# Patient Record
Sex: Male | Born: 1955 | Race: White | Hispanic: No | Marital: Married | Smoking: Former smoker
Health system: Southern US, Community
[De-identification: ages and names within clinical notes are randomized; demographics above are authoritative.]

## PROBLEM LIST (undated history)

## (undated) DIAGNOSIS — Z789 Other specified health status: Secondary | ICD-10-CM

## (undated) HISTORY — PX: APPENDECTOMY: SHX54

## (undated) HISTORY — PX: OTHER SURGICAL HISTORY: SHX169

---

## 2004-07-30 ENCOUNTER — Emergency Department (HOSPITAL_COMMUNITY): Admission: EM | Admit: 2004-07-30 | Discharge: 2004-07-31 | Payer: Self-pay | Admitting: Emergency Medicine

## 2004-08-03 ENCOUNTER — Ambulatory Visit (HOSPITAL_BASED_OUTPATIENT_CLINIC_OR_DEPARTMENT_OTHER): Admission: RE | Admit: 2004-08-03 | Discharge: 2004-08-03 | Payer: Self-pay | Admitting: Urology

## 2007-08-07 ENCOUNTER — Encounter: Admission: RE | Admit: 2007-08-07 | Discharge: 2007-08-07 | Payer: Self-pay | Admitting: Family Medicine

## 2011-04-29 NOTE — Op Note (Signed)
NAMEMICKEL, SCHREUR                         ACCOUNT NO.:  [id number]   MEDICAL RECORD NO.:  [id number]                   PATIENT TYPE:  AMB   LOCATION:  [facility]                                 FACILITY:  [facility]   PHYSICIAN:  Jamison Neighbor, M.D.               DATE OF BIRTH:  05/26/1956   DATE OF PROCEDURE:  08/03/2004  DATE OF DISCHARGE:                                 OPERATIVE REPORT   PREOPERATIVE DIAGNOSIS:  Right ureterovesical junction stone with associated  hydronephrosis.   POSTOPERATIVE DIAGNOSIS:  Right ureterovesical junction stone with  associated hydronephrosis.   PROCEDURE:  1. Cystoscopy.  2. Right retrograde, right ureteroscopy with basket extraction.   SURGEON:  Jamison Neighbor, M.D.   ANESTHESIA:  General.   COMPLICATIONS:  None.   DRAINS:  None.   BRIEF HISTORY:  This 55 year old male presented to the emergency room at  [facility] where a CT scan revealed a right UVJ stone.  The patient has had  right flank pain.  The patient was advised that the stone might pass but  because his pain has been persistent and poorly controlled, he has requested  basket extraction.  He understands the risks and benefits of the procedure  and gave full informed consent.   DESCRIPTION OF PROCEDURE:  After the successful induction of general  anesthesia, the patient was placed in the dorsal lithotomy position, prepped  with Betadine, and draped in the usual sterile fashion.  Cystoscopy was  performed, and the urethra was visualized in its entirety and was found to  be normal.  Beyond the verumontanum, the prostate was not particularly  enlarged.  There was no evidence of any bladder outlet obstruction.  The  bladder was carefully inspected.  It was free of any tumor or stones.  The  right ureteral orifice was noted to be edematous secondary to the  obstruction.  The left ureter was normal.  With some difficulty, the  ureteral catheter was advanced into the ureter.  It was  difficult to see the  ureteral opening due to all the heaped up inflamed mucosa, but a retrograde  study demonstrated hydronephrosis down to a tight spot right at the  intramural tunnel.  A guidewire was passed to the kidney.  The ureteroscope  was advanced into the ureter and without the need for dilation.  In the  distal ureter, a stone was seen.  Under direct vision, the basket was used  to grasp the stone, which was removed.  The ureteroscope was then  readvanced, and the remainder of the distal ureter up to the iliac vessels  was inspected.  No obstruction was noted.  Because this was so atraumatic  and dilation was not required, it was not felt that double-J catheter was  required.  The guidewire was removed.  The bladder was drained.  The patient  tolerated the procedure well and was taken to the  recovery room in good  condition.  He received intraoperative Toradol and Zofran as well as a B&O  suppository.  He will continue on his pain medication.  He will return to  see me in follow up.                                               Jamison Neighbor, M.D.    RJE/MEDQ  D:  08/03/2004  T:  08/03/2004  Job:  [id number]

## 2015-05-27 ENCOUNTER — Encounter: Payer: Self-pay | Admitting: Podiatry

## 2015-05-27 ENCOUNTER — Ambulatory Visit (INDEPENDENT_AMBULATORY_CARE_PROVIDER_SITE_OTHER): Payer: BLUE CROSS/BLUE SHIELD

## 2015-05-27 ENCOUNTER — Ambulatory Visit (INDEPENDENT_AMBULATORY_CARE_PROVIDER_SITE_OTHER): Payer: BLUE CROSS/BLUE SHIELD | Admitting: Podiatry

## 2015-05-27 DIAGNOSIS — M722 Plantar fascial fibromatosis: Secondary | ICD-10-CM

## 2015-05-27 DIAGNOSIS — R52 Pain, unspecified: Secondary | ICD-10-CM

## 2015-05-27 NOTE — Patient Instructions (Signed)
Bent - Knee Calf Stretch  1) Stand an arm's length away from a wall. Place the palms of your hands on the wall. Step forward about 12 inches with the opposite foot.  2) Keeping toes pointed forward and both heels on the floor, bend both knees and lean forward. Hold this position for 60 seconds. Don't arch your back and don't hunch your shoulders.  3) Repeat this twice.  DO THIS STRETCHING TECHNIQUE 3 TIMES A DAY.   Stretching Exercises before Standing      Pull your toes up toward your nose and hold for 1 minute before standing.  A towel can assist with this exercise if you put the towel under the ball of your foot. This exercise reduces the intense    pain associated when changing from a seated to a standing position. This stretch can usually be the most beneficial if done before getting out of bed in the mornings. Plantar Fasciitis Plantar fasciitis is a common condition that causes foot pain. It is soreness (inflammation) of the band of tough fibrous tissue on the bottom of the foot that runs from the heel bone (calcaneus) to the ball of the foot. The cause of this soreness may be from excessive standing, poor fitting shoes, running on hard surfaces, being overweight, having an abnormal walk, or overuse (this is common in runners) of the painful foot or feet. It is also common in aerobic exercise dancers and ballet dancers. SYMPTOMS  Most people with plantar fasciitis complain of:  Severe pain in the morning on the bottom of their foot especially when taking the first steps out of bed. This pain recedes after a few minutes of walking.  Severe pain is experienced also during walking following a long period of inactivity.  Pain is worse when walking barefoot or up stairs DIAGNOSIS   Your caregiver will diagnose this condition by examining and feeling your foot.  Special tests such as X-rays of your foot, are usually not needed. PREVENTION   Consult a sports medicine professional  before beginning a new exercise program.  Walking programs offer a good workout. With walking there is a lower chance of overuse injuries common to runners. There is less impact and less jarring of the joints.  Begin all new exercise programs slowly. If problems or pain develop, decrease the amount of time or distance until you are at a comfortable level.  Wear good shoes and replace them regularly.  Stretch your foot and the heel cords at the back of the ankle (Achilles tendon) both before and after exercise.  Run or exercise on even surfaces that are not hard. For example, asphalt is better than pavement.  Do not run barefoot on hard surfaces.  If using a treadmill, vary the incline.  Do not continue to workout if you have foot or joint problems. Seek professional help if they do not improve. HOME CARE INSTRUCTIONS   Avoid activities that cause you pain until you recover.  Use ice or cold packs on the problem or painful areas after working out.  Only take over-the-counter or prescription medicines for pain, discomfort, or fever as directed by your caregiver.  Soft shoe inserts or athletic shoes with air or gel sole cushions may be helpful.  If problems continue or become more severe, consult a sports medicine caregiver or your own health care provider. Cortisone is a potent anti-inflammatory medication that may be injected into the painful area. You can discuss this treatment with your caregiver. MAKE   SURE YOU:   Understand these instructions.  Will watch your condition.  Will get help right away if you are not doing well or get worse. Document Released: 08/23/2001 Document Revised: 02/20/2012 Document Reviewed: 10/22/2008 ExitCare Patient Information 2015 [employer]. This information is not intended to replace advice given to you by your health care provider. Make sure you discuss any questions you have with your health care provider.  

## 2015-05-27 NOTE — Progress Notes (Signed)
   Subjective:    Patient ID: Henry Silva, male    DOB: 01-Nov-1956, 59 y.o.   MRN: [id number]  HPI  N-BURNING SENSATION, PAIN L-LT FOOT BOTTOM AND BACK HEEL D-1-2 MONTH O-SLOWLY C-WORSE ALL DAY A-PRESSURE T-ICE, ADVIL  He denies any direct injury. 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   Eyes: Positive for itching.  Musculoskeletal: Positive for joint swelling.       Objective:   Physical Exam  Orientated 3  Vascular: DP and PT pulses 2/4 bilaterally Capillary reflex immediate bilaterally No peripheral edema noted bilaterally  Neurological: Sensation to 10 g monofilament wire intact 5/5 bilaterally Vibratory sensation intact bilaterally Ankle reflex equal and reactive bilaterally  Dermatological: No skin lesions noted bilaterally Texture and turgor adequate bilaterally  Musculoskeletal: Patient has a limping gait favoring the left foot Palpable tenderness medial plantar fascial area and central inferior heel left without any palpable lesions. This duplicates area of discomfort. 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 I offered patient Kenalog injection he verbally consents.  Skin is prepped with alcohol and Betadine and 10 mg of Kenalog mixed with 10 mg of plain Xylocaine and 2.5 mg of plain Marcaine were injected into the inferior heel  left for Kenalog injection #1. Patient tolerated procedure without any difficulty. Fasciitis strap dispensed to wear on the left foot Shoeing and stretching discussed  Reappoint if symptoms are not improving within the next 6 weeks

## 2015-05-28 DIAGNOSIS — M722 Plantar fascial fibromatosis: Secondary | ICD-10-CM

## 2015-05-28 MED ORDER — TRIAMCINOLONE ACETONIDE 10 MG/ML IJ SUSP
10.0000 mg | Freq: Once | INTRAMUSCULAR | Status: AC
  Administered 2015-05-28: 10 mg

## 2015-12-24 ENCOUNTER — Ambulatory Visit (INDEPENDENT_AMBULATORY_CARE_PROVIDER_SITE_OTHER): Payer: BLUE CROSS/BLUE SHIELD

## 2015-12-24 ENCOUNTER — Ambulatory Visit (INDEPENDENT_AMBULATORY_CARE_PROVIDER_SITE_OTHER): Payer: BLUE CROSS/BLUE SHIELD | Admitting: Podiatry

## 2015-12-24 ENCOUNTER — Encounter: Payer: Self-pay | Admitting: Podiatry

## 2015-12-24 VITALS — BP 129/82 | HR 74 | Temp 97.8°F | Resp 18

## 2015-12-24 DIAGNOSIS — M779 Enthesopathy, unspecified: Secondary | ICD-10-CM

## 2015-12-24 DIAGNOSIS — M109 Gout, unspecified: Secondary | ICD-10-CM

## 2015-12-24 DIAGNOSIS — M10071 Idiopathic gout, right ankle and foot: Secondary | ICD-10-CM | POA: Diagnosis not present

## 2015-12-24 DIAGNOSIS — R52 Pain, unspecified: Secondary | ICD-10-CM | POA: Diagnosis not present

## 2015-12-24 LAB — CBC WITH DIFFERENTIAL/PLATELET
BASOS PCT: 1 % (ref 0–1)
Basophils Absolute: 0.1 10*3/uL (ref 0.0–0.1)
EOS ABS: 0.2 10*3/uL (ref 0.0–0.7)
Eosinophils Relative: 3 % (ref 0–5)
HCT: 44.7 % (ref 39.0–52.0)
Hemoglobin: 15.2 g/dL (ref 13.0–17.0)
Lymphocytes Relative: 18 % (ref 12–46)
Lymphs Abs: 1 10*3/uL (ref 0.7–4.0)
MCH: 31.8 pg (ref 26.0–34.0)
MCHC: 34 g/dL (ref 30.0–36.0)
MCV: 93.5 fL (ref 78.0–100.0)
MONOS PCT: 13 % — AB (ref 3–12)
MPV: 9.9 fL (ref 8.6–12.4)
Monocytes Absolute: 0.7 10*3/uL (ref 0.1–1.0)
NEUTROS PCT: 65 % (ref 43–77)
Neutro Abs: 3.6 10*3/uL (ref 1.7–7.7)
PLATELETS: 258 10*3/uL (ref 150–400)
RBC: 4.78 MIL/uL (ref 4.22–5.81)
RDW: 13.2 % (ref 11.5–15.5)
WBC: 5.6 10*3/uL (ref 4.0–10.5)

## 2015-12-24 LAB — C-REACTIVE PROTEIN: CRP: 0.9 mg/dL — ABNORMAL HIGH (ref <0.60)

## 2015-12-24 LAB — URIC ACID: URIC ACID, SERUM: 6.6 mg/dL (ref 4.0–7.8)

## 2015-12-24 MED ORDER — TRAMADOL HCL 50 MG PO TABS: 50.0000 mg | ORAL_TABLET | Freq: Three times a day (TID) | ORAL | Status: DC | PRN

## 2015-12-24 MED ORDER — COLCHICINE 0.6 MG PO TABS: 0.6000 mg | ORAL_TABLET | Freq: Every day | ORAL | Status: DC

## 2015-12-24 MED ORDER — CEPHALEXIN 500 MG PO CAPS: 500.0000 mg | ORAL_CAPSULE | Freq: Three times a day (TID) | ORAL | Status: DC

## 2015-12-24 NOTE — Patient Instructions (Signed)

## 2015-12-25 LAB — SEDIMENTATION RATE: Sed Rate: 4 mm/hr (ref 0–20)

## 2015-12-27 ENCOUNTER — Encounter: Payer: Self-pay | Admitting: Podiatry

## 2015-12-27 NOTE — Progress Notes (Signed)
Patient ID: Henry Silva, male   DOB: Sep 25, 1956, 60 y.o.   MRN: [id number]  Subjective: 60 year old male presents the office with concerns of right foot big toe swelling or redness which has been ongoing for possibly one month but seems to worsen over the last couple of days. He states the areas. Painful with pressure and weightbearing and try to bend his big toe. He denies any recent injury or trauma. Denies any numbness or tingling. No open sores or drainage. Denies any systemic complaints such as fevers, chills, nausea, vomiting. No acute changes since last appointment, and no other complaints at this time.   Objective: AAO x3, NAD DP/PT pulses palpable bilaterally, CRT less than 3 seconds Protective sensation intact with Simms Weinstein monofilament There is edema and erythema to the right first MTPJ with decreased range of motion of the first MTPJ due to pain. There is no fluctuance or crepitus. No ascending cellulitis. There is mild increase in warmth of the first MTPJ. This area appears to be localized without any spreading. No other areas of tenderness to bilateral lower extremity. No areas of pinpoint bony tenderness or pain with vibratory sensation. MMT 5/5, ROM WNL. No edema, erythema, increase in warmth to bilateral lower extremities.  No open lesions or pre-ulcerative lesions.  No pain with calf compression, swelling, warmth, erythema  Assessment: 60 year old male right first MTPJ likely gout, capsulitis  Plan: -All treatment options discussed with the patient including all alternatives, risks, complications.  -X-rays were obtained and reviewed with the patient. There is no definitive evidence of acute fracture or stress fracture identified at this time. -Etiology of symptoms were discussed -At this time I discussed with him this is likely gout. Discussed steroid injection for which she wishes to proceed after discussing risks and complications. Under sterile conditions a mixture  Kenalog 10 and 0.5% Marcaine plain was infiltrated into and around the first MTPJ without compensation's. Post procedure instructions were discussed. -Prescribed colchicine -Prescribed Keflex in case of infection, although unlikely -Order blood work including uric acid, ESR, CRP, CBC -Follow-up in 2 weeks or sooner if any problems arise. In the meantime, encouraged to call the office with any questions, concerns, change in symptoms.   Celesta Gentile, DPM

## 2015-12-28 ENCOUNTER — Telehealth: Payer: Self-pay | Admitting: *Deleted

## 2015-12-28 NOTE — Telephone Encounter (Addendum)
-----   Message from Vivi BarrackMatthew R Wagoner, DPM sent at 12/27/2015  9:32 AM EST ----- Can you let him know that his blood work was mostly normal except one of the inflammation markers is slightly elevated, which is non specific. I still think he has gout. Continue medication and follow-up next week or sooner if it is worsening or not getting better.   12/28/2015 - left message with pt's son for pt to call for results.  Called pt and informed of Dr. Gabriel RungWagoner's orders, pt states has an appt 01/11/2016.

## 2016-01-11 ENCOUNTER — Ambulatory Visit (INDEPENDENT_AMBULATORY_CARE_PROVIDER_SITE_OTHER): Payer: BLUE CROSS/BLUE SHIELD | Admitting: Podiatry

## 2016-01-11 ENCOUNTER — Encounter: Payer: Self-pay | Admitting: Podiatry

## 2016-01-11 VITALS — BP 143/89 | HR 74 | Resp 17

## 2016-01-11 DIAGNOSIS — M109 Gout, unspecified: Secondary | ICD-10-CM

## 2016-01-11 DIAGNOSIS — M10071 Idiopathic gout, right ankle and foot: Secondary | ICD-10-CM | POA: Diagnosis not present

## 2016-01-11 MED ORDER — METHYLPREDNISOLONE 4 MG PO TBPK: ORAL_TABLET | ORAL | Status: DC

## 2016-01-11 NOTE — Patient Instructions (Addendum)

## 2016-01-11 NOTE — Progress Notes (Signed)
Patient ID: Henry Silva, male   DOB: August 08, 1956, 60 y.o.   MRN: [id number]  Subjective: 60 year old male persists the office if up evaluation of gout to the right foot. He states the symptoms have improved following discontinue gets some discomfort. He denies colchicine without any significant side effects. He is able to a regular shoe although does put pressure when wearing still toed shoe overlying the bunion on the right foot. Denies any systemic complaints such as fevers, chills, nausea, vomiting. No acute changes since last appointment, and no other complaints at this time.   Objective: AAO x3, NAD DP/PT pulses palpable bilaterally, CRT less than 3 seconds Protective sensation intact with Simms Weinstein monofilament They're significantly improved edema and erythema to the right first MTPJ although it does continue somewhat. Is no pain with first MTPJ range of motion. There is no other area pinpoint tenderness or pain the vibratory sensation. No areas of pinpoint bony tenderness or pain with vibratory sensation. MMT 5/5, ROM WNL. No edema, erythema, increase in warmth to bilateral lower extremities.  No open lesions or pre-ulcerative lesions.  No pain with calf compression, swelling, warmth, erythema  Assessment: Gout right foot resolving although with mild continued symptoms  Plan: -All treatment options discussed with the patient including all alternatives, risks, complications.  -Prescribed medorl dose pack. Discussed side effects of the medication and directed to stop if any are to occur and call the office.  -Discussed etiologies of gout and foods to avoid.  -Offloading pads -Follow-up if symptoms not resolved in 3 weeks or sooner if any problems arise. In the meantime, encouraged to call the office with any questions, concerns, change in symptoms.   Ovid Curd, DPM

## 2016-02-16 ENCOUNTER — Telehealth: Payer: Self-pay

## 2016-02-16 NOTE — Telephone Encounter (Signed)
PT left Vm that he is ready to schedule a colonoscopy.

## 2016-02-17 NOTE — Telephone Encounter (Signed)
Tried to call pt and mailbox is full

## 2016-02-18 NOTE — Telephone Encounter (Signed)
LMOM to call.

## 2016-02-25 NOTE — Telephone Encounter (Signed)
Letter mailed for pt to call.  

## 2016-03-02 ENCOUNTER — Ambulatory Visit
Admission: EM | Admit: 2016-03-02 | Discharge: 2016-03-02 | Disposition: A | Discharge status: Home / Self Care | Payer: Worker's Compensation | Attending: Family Medicine | Admitting: Family Medicine

## 2016-03-02 ENCOUNTER — Encounter: Payer: Self-pay | Admitting: Emergency Medicine

## 2016-03-02 ENCOUNTER — Ambulatory Visit (INDEPENDENT_AMBULATORY_CARE_PROVIDER_SITE_OTHER): Payer: Worker's Compensation

## 2016-03-02 ENCOUNTER — Telehealth: Payer: Self-pay

## 2016-03-02 DIAGNOSIS — S8992XA Unspecified injury of left lower leg, initial encounter: Secondary | ICD-10-CM | POA: Diagnosis not present

## 2016-03-02 MED ORDER — OXYCODONE-ACETAMINOPHEN 2.5-325 MG PO TABS: 1.0000 | ORAL_TABLET | Freq: Every evening | ORAL | Status: DC | PRN

## 2016-03-02 NOTE — ED Notes (Signed)
Workers Comp: Left knee injury 1 day ago

## 2016-03-02 NOTE — Telephone Encounter (Signed)
Pt's wife, Lawson FiscalLori, left message with Candy for me to call her phone number to schedule the pt's colonoscopy. I called and mail box is full and I could not leave a message.

## 2016-03-02 NOTE — ED Provider Notes (Signed)
CSN: [id number]     Arrival date & time 03/02/16  1534 History   First MD Initiated Contact with Patient 03/02/16 1656     Chief Complaint  Patient presents with  . Knee Injury   (Consider location/radiation/quality/duration/timing/severity/associated sxs/prior Treatment) HPI: Patient presents today with symptoms of left knee pain. Patient states that he was at work yesterday when his foot hit a metal rod in his knee went forward and hit something. He recalls having pain right away he denies any abrasions or bleeding of the knee. He denies any pain of the foot or ankle at this time. He has had his left knee scoped in the past which she believes was for meniscal tear. He states that extension fully and flexion fully causes him to have pain. He has had some swelling of the area and has been applying ice since the injury. He is able to walk with some discomfort.  History reviewed. No pertinent past medical history. History reviewed. No pertinent past surgical history. No family history on file. Social History  Substance Use Topics  . Smoking status: Never Smoker   . Smokeless tobacco: None  . Alcohol Use: No    Review of Systems: Negative except mentioned above.   Allergies  Neosporin lt  Home Medications   Prior to Admission medications   Medication Sig Start Date End Date Taking? Authorizing Provider  Ibuprofen (ADVIL) 200 MG CAPS Take by mouth.   Yes Historical Provider, MD  cephALEXin (KEFLEX) 500 MG capsule Take 1 capsule (500 mg total) by mouth 3 (three) times daily. 12/24/15   Vivi BarrackMatthew R Wagoner, DPM  colchicine 0.6 MG tablet Take 1 tablet (0.6 mg total) by mouth daily. 12/24/15   Vivi BarrackMatthew R Wagoner, DPM  methylPREDNISolone (MEDROL DOSEPAK) 4 MG TBPK tablet Take as directed 01/11/16   Vivi BarrackMatthew R Wagoner, DPM  traMADol (ULTRAM) 50 MG tablet Take 1 tablet (50 mg total) by mouth every 8 (eight) hours as needed. 12/24/15   Vivi BarrackMatthew R Wagoner, DPM   Meds Ordered and Administered this Visit   Medications - No data to display  BP 133/82 mmHg  Pulse 78  Temp(Src) 98.4 F (36.9 C)  Resp 18  Ht 6\' 1"  (1.854 m)  Wt 220 lb (99.791 kg)  BMI 29.03 kg/m2  SpO2 99% No data found.   Physical Exam    GENERAL: NAD RESP: CTA B CARD: RRR MSK: L Knee-minimal effusion, generalized anterior knee tenderness, FROM, -Lachman, -Drawer, -[place]urry, nv intact  NEURO: CN II-XII grossly intact   ED Course  Procedures (including critical care time)  Labs Review Labs Reviewed - No data to display  Imaging Review No results found.   MDM   A/P: Left Knee Injury-X-rays reviewed, history and exam suggestive of contusion to the knee, RICE recommended (he states he has a compression sleeve), restrictions for work only for a week include no bending or kneeling. Patient is to take Percocet only at night if needed for pain. He states that he has Aleve that he can use during the day. If his symptoms do persist or worsen I do recommend that [facility] for further evaluation/imaging, orthopedic referral if needed. Patient addresses understanding of this.   Jolene ProvostKirtida Giabella Duhart, MD 03/02/16 [phone]

## 2016-03-02 NOTE — Discharge Instructions (Signed)
Rest, ice, compression, elevation. Certain restrictions for work given until next week. If symptoms persist or worsen patient is to follow-up as listed above.

## 2016-03-08 ENCOUNTER — Telehealth: Payer: Self-pay

## 2016-03-09 NOTE — Telephone Encounter (Signed)
See separate triage.  

## 2016-03-11 NOTE — Telephone Encounter (Signed)
SUPREP SPLIT DOSING- FULL LIQUIDS WITH BREAKFAST.  Full Liquid Diet A high-calorie, high-protein supplement should be used to meet your nutritional requirements when the full liquid diet is continued for more than 2 or 3 days. If this diet is to be used for an extended period of time (more than 7 days), a multivitamin should be considered.  Breads and Starches  Allowed: None are allowed except crackers pureed (made into a thick, smooth soup) in soup.   Avoid: Any others.    Potatoes/Pasta/Rice  Allowed: ANY ITEM AS A SOUP OR SMALL PLATE OF MASHED POTATOES OR SCRAMBLED EGGS.       Vegetables  Allowed: Strained tomato or vegetable juice. Vegetables pureed in soup.   Avoid: Any others.    Fruit  Allowed: Any strained fruit juices and fruit drinks. Include 1 serving of citrus or vitamin C-enriched fruit juice daily.   Avoid: Any others.  Meat and Meat Substitutes  Allowed: Egg  Avoid: Any meat, fish, or fowl. All cheese.  Milk  Allowed: SOY Milk beverages, including milk shakes and instant breakfast mixes. Smooth yogurt.   Avoid: Any others. Avoid dairy products if not tolerated.    Soups and Combination Foods  Allowed: Broth, strained cream soups. Strained, broth-based soups.   Avoid: Any others.    Desserts and Sweets  Allowed: flavored gelatin, tapioca, ice cream, sherbet, smooth pudding, junket, fruit ices, frozen ice pops, pudding pops, frozen fudge pops, chocolate syrup. Sugar, honey, jelly, syrup.   Avoid: Any others.  Fats and Oils  Allowed: Margarine, butter, cream, sour cream, oils.   Avoid: Any others.  Beverages  Allowed: All.   Avoid: None.  Condiments  Allowed: Iodized salt, pepper, spices, flavorings. Cocoa powder.   Avoid: Any others.    SAMPLE MEAL PLAN Breakfast   cup orange juice.   1 OR 2 EGGS  1 cup milk.   1 cup beverage (coffee or tea).   Cream or sugar, if desired.    Midmorning Snack  2 SCRAMBLED OR HARD  BOILED EGG   Lunch  1 cup cream soup.    cup fruit juice.   1 cup milk.    cup custard.   1 cup beverage (coffee or tea).   Cream or sugar, if desired.    Midafternoon Snack  1 cup milk shake.  Dinner  1 cup cream soup.    cup fruit juice.   1 cup MILK    cup pudding.   1 cup beverage (coffee or tea).   Cream or sugar, if desired.  Evening Snack  1 cup supplement.  To increase calories, add sugar, cream, butter, or margarine if possible. Nutritional supplements will also increase the total calories. 

## 2016-03-11 NOTE — Telephone Encounter (Signed)
Gastroenterology Pre-Procedure Review  Request Date: 03/08/2016 Requesting Physician: Francene BoyersSusan Peterson, MD  PATIENT REVIEW QUESTIONS: The patient responded to the following health history questions as indicated:    1. Diabetes Melitis: no 2. Joint replacements in the past 12 months: no 3. Major health problems in the past 3 months: no 4. Has an artificial valve or MVP: no 5. Has a defibrillator: no 6. Has been advised in past to take antibiotics in advance of a procedure like teeth cleaning: no 7. Family history of colon cancer: no  8. Alcohol Use: no 9. History of sleep apnea: YES   DIAGNOSED YEARS AGO BUT DOES NOT USE C PAP NOW   MEDICATIONS & ALLERGIES:    Patient reports the following regarding taking any blood thinners:   Plavix? no Aspirin? no Coumadin? no  Patient confirms/reports the following medications:  Current Outpatient Prescriptions  Medication Sig Dispense Refill  . Ibuprofen (ADVIL) 200 MG CAPS Take by mouth.    . cephALEXin (KEFLEX) 500 MG capsule Take 1 capsule (500 mg total) by mouth 3 (three) times daily. (Patient not taking: Reported on 03/08/2016) 30 capsule 0  . colchicine 0.6 MG tablet Take 1 tablet (0.6 mg total) by mouth daily. (Patient not taking: Reported on 03/08/2016) 10 tablet 2  . methylPREDNISolone (MEDROL DOSEPAK) 4 MG TBPK tablet Take as directed (Patient not taking: Reported on 03/08/2016) 21 tablet 0  . oxycodone-acetaminophen (PERCOCET) 2.5-325 MG tablet Take 1 tablet by mouth at bedtime as needed for pain. (Patient not taking: Reported on 03/08/2016) 10 tablet 0  . traMADol (ULTRAM) 50 MG tablet Take 1 tablet (50 mg total) by mouth every 8 (eight) hours as needed. (Patient not taking: Reported on 03/08/2016) 20 tablet 0   No current facility-administered medications for this visit.    Patient confirms/reports the following allergies:  Allergies  Allergen Reactions  . Neosporin Lt [Allantoin-Pramoxine]     No orders of the defined types were  placed in this encounter.    AUTHORIZATION INFORMATION Primary Insurance:   ID #:   Group #:  Pre-Cert / Auth required: Pre-Cert / Auth #:   Secondary Insurance:   ID #:  Group #:  Pre-Cert / Auth required:  Pre-Cert / Auth #:   SCHEDULE INFORMATION: Procedure has been scheduled as follows:  Date: 04/08/2016              Time:  9:30 am Location: [facility] Short Stay  This Gastroenterology Pre-Precedure Review Form is being routed to the following provider(s): Jonette EvaSandi Fields, MD

## 2016-03-14 ENCOUNTER — Other Ambulatory Visit: Payer: Self-pay

## 2016-03-14 DIAGNOSIS — Z1211 Encounter for screening for malignant neoplasm of colon: Secondary | ICD-10-CM

## 2016-03-14 MED ORDER — NA SULFATE-K SULFATE-MG SULF 17.5-3.13-1.6 GM/177ML PO SOLN: 1.0000 | ORAL | Status: AC

## 2016-03-14 NOTE — Telephone Encounter (Signed)
Rx was sent to the pharmacy and instructions mailed to pt.  

## 2016-04-06 ENCOUNTER — Telehealth: Payer: Self-pay

## 2016-04-06 NOTE — Telephone Encounter (Signed)
Called pt to inform him of time change on his procedure. Pt is aware to be there at 0945

## 2016-04-06 NOTE — Telephone Encounter (Signed)
No PA is needed for TCS per Fannie KneeSue A. 04/06/16

## 2016-04-08 ENCOUNTER — Encounter (HOSPITAL_COMMUNITY)
Admission: RE | Disposition: A | Discharge status: Home / Self Care | Payer: Self-pay | Source: Ambulatory Visit | Attending: Gastroenterology

## 2016-04-08 ENCOUNTER — Encounter (HOSPITAL_COMMUNITY): Payer: Self-pay | Admitting: *Deleted

## 2016-04-08 ENCOUNTER — Ambulatory Visit (HOSPITAL_COMMUNITY)
Admission: RE | Admit: 2016-04-08 | Discharge: 2016-04-08 | Disposition: A | Discharge status: Home / Self Care | Payer: BLUE CROSS/BLUE SHIELD | Source: Ambulatory Visit | Attending: Gastroenterology | Admitting: Gastroenterology

## 2016-04-08 DIAGNOSIS — K644 Residual hemorrhoidal skin tags: Secondary | ICD-10-CM | POA: Insufficient documentation

## 2016-04-08 DIAGNOSIS — K5730 Diverticulosis of large intestine without perforation or abscess without bleeding: Secondary | ICD-10-CM | POA: Insufficient documentation

## 2016-04-08 DIAGNOSIS — Z87891 Personal history of nicotine dependence: Secondary | ICD-10-CM | POA: Diagnosis not present

## 2016-04-08 DIAGNOSIS — Z1211 Encounter for screening for malignant neoplasm of colon: Secondary | ICD-10-CM | POA: Diagnosis present

## 2016-04-08 HISTORY — DX: Other specified health status: Z78.9

## 2016-04-08 HISTORY — PX: COLONOSCOPY: SHX5424

## 2016-04-08 SURGERY — COLONOSCOPY
Anesthesia: Moderate Sedation

## 2016-04-08 MED ORDER — MEPERIDINE HCL 100 MG/ML IJ SOLN
INTRAMUSCULAR | Status: DC | PRN
  Administered 2016-04-08 (×2): 25 mg via INTRAVENOUS

## 2016-04-08 MED ORDER — SIMETHICONE 40 MG/0.6ML PO SUSP
ORAL | Status: DC | PRN
  Administered 2016-04-08: 11:00:00

## 2016-04-08 MED ORDER — MIDAZOLAM HCL 5 MG/5ML IJ SOLN
INTRAMUSCULAR | Status: AC
  Filled 2016-04-08: qty 10

## 2016-04-08 MED ORDER — SODIUM CHLORIDE 0.9 % IV SOLN
INTRAVENOUS | Status: DC
Start: 2016-04-08 — End: 2016-04-08
  Administered 2016-04-08: 10:00:00 via INTRAVENOUS

## 2016-04-08 MED ORDER — MIDAZOLAM HCL 5 MG/5ML IJ SOLN
INTRAMUSCULAR | Status: DC | PRN
  Administered 2016-04-08 (×2): 2 mg via INTRAVENOUS

## 2016-04-08 MED ORDER — MEPERIDINE HCL 100 MG/ML IJ SOLN
INTRAMUSCULAR | Status: AC
  Filled 2016-04-08: qty 2

## 2016-04-08 NOTE — H&P (Signed)
  Primary Care Physician:  No PCP Per Patient Primary Gastroenterologist:  Dr. Oneida Alar  Pre-Procedure History & Physical: HPI:  Henry Silva is a 60 y.o. male here for COLON CANCER SCREENING.  Past Medical History  Diagnosis Date  . Medical history non-contributory     Past Surgical History  Procedure Laterality Date  . Appendectomy    . Left knee arthroscopy      X 2    Prior to Admission medications   Medication Sig Start Date End Date Taking? Authorizing Provider  Ibuprofen (ADVIL) 200 MG CAPS Take 600 mg by mouth daily as needed (pain).    Yes Historical Provider, MD  Na Sulfate-K Sulfate-Mg Sulf (SUPREP BOWEL PREP) SOLN Take 1 kit by mouth as directed. 03/14/16  Yes Danie Binder, MD    Allergies as of 03/14/2016 - Review Complete 03/08/2016  Allergen Reaction Noted  . Neosporin lt [allantoin-pramoxine]  05/27/2015    History reviewed. No pertinent family history.  Social History   Social History  . Marital Status: Married    Spouse Name: N/A  . Number of Children: N/A  . Years of Education: N/A   Occupational History  . Not on file.   Social History Main Topics  . Smoking status: Former Smoker -- 0.50 packs/day for 15 years    Types: Cigarettes    Quit date: 03/08/1994  . Smokeless tobacco: Not on file  . Alcohol Use: No  . Drug Use: No  . Sexual Activity: Not on file   Other Topics Concern  . Not on file   Social History Narrative    Review of Systems: See HPI, otherwise negative ROS   Physical Exam: BP 141/91 mmHg  Pulse 66  Temp(Src) 98.6 F (37 C) (Oral)  Resp 14  Ht '6\' 1"'$  (1.854 m)  Wt 227 lb (102.967 kg)  BMI 29.96 kg/m2  SpO2 99% General:   Alert,  pleasant and cooperative in NAD Head:  Normocephalic and atraumatic. Neck:  Supple; Lungs:  Clear throughout to auscultation.    Heart:  Regular rate and rhythm. Abdomen:  Soft, nontender and nondistended. Normal bowel sounds, without guarding, and without rebound.   Neurologic:   Alert and  oriented x4;  grossly normal neurologically.  Impression/Plan:     SCREENING  Plan:  1. TCS TODAY

## 2016-04-08 NOTE — Op Note (Signed)
[facility] Patient Name: Henry Silva Procedure Date: 04/08/2016 10:57 AM MRN: [id number] Date of Birth: 11/07/1956 Attending MD: Jonette EvaSandi Dametria Tuzzolino , MD CSN: [id number] Age: [age] Admit Type: Outpatient Procedure:                Colonoscopy Indications:              Screening for colorectal malignant neoplasm Providers:                Jonette EvaSandi Shameika Speelman, MD, Edrick Kinsammy Vaught, RN, Calton Dachaylor Lemons,                            Technician Referring MD:             Francene BoyersSUSAN PETERSON, MD Medicines:                Meperidine 50 mg IV, Midazolam 4 mg IV Complications:            No immediate complications. Estimated Blood Loss:     Estimated blood loss: none. Procedure:                Pre-Anesthesia Assessment:                           - Prior to the procedure, a History and Physical                            was performed, and patient medications and                            allergies were reviewed. The patient's tolerance of                            previous anesthesia was also reviewed. The risks                            and benefits of the procedure and the sedation                            options and risks were discussed with the patient.                            All questions were answered, and informed consent                            was obtained. Prior Anticoagulants: The patient has                            taken no previous anticoagulant or antiplatelet                            agents. ASA Grade Assessment: I - A normal, healthy                            patient. After reviewing the risks and benefits,  the patient was deemed in satisfactory condition to                            undergo the procedure.                           After obtaining informed consent, the colonoscope                            was passed under direct vision. Throughout the                            procedure, the patient's blood pressure, pulse, and                             oxygen saturations were monitored continuously. The                            EC-[id number] ([id number]) scope was introduced through                            the anus and advanced to the the cecum, identified                            by appendiceal orifice and ileocecal valve. The                            colonoscopy was performed without difficulty. The                            patient tolerated the procedure well. The ileocecal                            valve, appendiceal orifice, and rectum were                            photographed. The quality of the bowel preparation                            was excellent. Scope In: 11:11:09 AM Scope Out: 11:27:12 AM Scope Withdrawal Time: 0 hours 12 minutes 53 seconds  Total Procedure Duration: 0 hours 16 minutes 3 seconds  Findings:      The digital rectal exam findings include non-thrombosed external       hemorrhoids.      A few small and large-mouthed diverticula were found in the sigmoid       colon, descending colon and proximal transverse colon.      The exam was otherwise without abnormality.      Non-bleeding internal hemorrhoids were found. Impression:               - Non-thrombosed external hemorrhoids found on                            digital rectal exam.                           -  Diverticulosis in the sigmoid colon, in the                            descending colon and in the proximal transverse                            colon.                           - The examination was otherwise normal.                           - Non-bleeding internal hemorrhoids.                           - No specimens collected. Moderate Sedation:      Moderate (conscious) sedation was administered by the endoscopy nurse       and supervised by the endoscopist. The following parameters were       monitored: oxygen saturation, heart rate, blood pressure, and response       to care. Total physician intraservice time was 26  minutes. Recommendation:           - Patient has a contact number available for                            emergencies. The signs and symptoms of potential                            delayed complications were discussed with the                            patient. Return to normal activities tomorrow.                            Written discharge instructions were provided to the                            patient.                           - High fiber diet.                           - Continue present medications.                           - Repeat colonoscopy in 10 years for surveillance. Procedure Code(s):        --- Professional ---                           [phone], Colonoscopy, flexible; diagnostic, including                            collection of specimen(s) by brushing or washing,  when performed (separate procedure)                           [id number], Moderate sedation services; each additional                            15 minutes intraservice time                           G0500, Moderate sedation services provided by the                            same physician or other qualified health care                            professional performing a gastrointestinal                            endoscopic service that sedation supports,                            requiring the presence of an independent trained                            observer to assist in the monitoring of the                            patient's level of consciousness and physiological                            status; initial 15 minutes of intra-service time;                            patient age 74 years or older (additional time may                            be reported with [id number], as appropriate) Diagnosis Code(s):        --- Professional ---                           Z12.11, Encounter for screening for malignant                            neoplasm of colon                            K64.4, Residual hemorrhoidal skin tags                           K64.8, Other hemorrhoids                           K57.30, Diverticulosis of large intestine without                            perforation or abscess without  bleeding CPT copyright 2016 [employer]. All rights reserved. The codes documented in this report are preliminary and upon coder review may  be revised to meet current compliance requirements. Jonette Eva, MD Jonette Eva, MD 04/08/2016 3:00:07 PM This report has been signed electronically. Number of Addenda: 0

## 2016-04-08 NOTE — Discharge Instructions (Signed)
You have small internal hemorrhoids and diverticulosis IN YOUR RIGHT AND LEFT COLON.   DRINK WATER TO KEEP YOUR URINE LIGHT YELLOW.  FOLLOW A HIGH FIBER DIET. AVOID ITEMS THAT CAUSE BLOATING & GAS. See info below.  Next colonoscopy in 10 years.  Colonoscopy Care After Read the instructions outlined below and refer to this sheet in the next week. These discharge instructions provide you with general information on caring for yourself after you leave the hospital. While your treatment has been planned according to the most current medical practices available, unavoidable complications occasionally occur. If you have any problems or questions after discharge, call DR. Mckell Riecke, [phone].  ACTIVITY  You may resume your regular activity, but move at a slower pace for the next 24 hours.   Take frequent rest periods for the next 24 hours.   Walking will help get rid of the air and reduce the bloated feeling in your belly (abdomen).   No driving for 24 hours (because of the medicine (anesthesia) used during the test).   You may shower.   Do not sign any important legal documents or operate any machinery for 24 hours (because of the anesthesia used during the test).    NUTRITION  Drink plenty of fluids.   You may resume your normal diet as instructed by your doctor.   Begin with a light meal and progress to your normal diet. Heavy or fried foods are harder to digest and may make you feel sick to your stomach (nauseated).   Avoid alcoholic beverages for 24 hours or as instructed.    MEDICATIONS  You may resume your normal medications.   WHAT YOU CAN EXPECT TODAY  Some feelings of bloating in the abdomen.   Passage of more gas than usual.   Spotting of blood in your stool or on the toilet paper  .  IF YOU HAD POLYPS REMOVED DURING THE COLONOSCOPY:  Eat a soft diet IF YOU HAVE NAUSEA, BLOATING, ABDOMINAL PAIN, OR VOMITING.    FINDING OUT THE RESULTS OF YOUR TEST Not  all test results are available during your visit. DR. Darrick Penna WILL CALL YOU WITHIN 7 DAYS OF YOUR PROCEDUE WITH YOUR RESULTS. Do not assume everything is normal if you have not heard from DR. Skyelar Halliday IN ONE WEEK, CALL HER OFFICE AT [phone].  SEEK IMMEDIATE MEDICAL ATTENTION AND CALL THE OFFICE: [phone] IF:  You have more than a spotting of blood in your stool.   Your belly is swollen (abdominal distention).   You are nauseated or vomiting.   You have a temperature over 101F.   You have abdominal pain or discomfort that is severe or gets worse throughout the day.  High-Fiber Diet A high-fiber diet changes your normal diet to include more whole grains, legumes, fruits, and vegetables. Changes in the diet involve replacing refined carbohydrates with unrefined foods. The calorie level of the diet is essentially unchanged. The Dietary Reference Intake (recommended amount) for adult males is 38 grams per day. For adult females, it is 25 grams per day. Pregnant and lactating women should consume 28 grams of fiber per day. Fiber is the intact part of a plant that is not broken down during digestion. Functional fiber is fiber that has been isolated from the plant to provide a beneficial effect in the body.  PURPOSE  Increase stool bulk.   Ease and regulate bowel movements.   Lower cholesterol.  REDUCE RISK OF COLON CANCER  INDICATIONS THAT YOU NEED MORE FIBER  Constipation and hemorrhoids.   Uncomplicated diverticulosis (intestine condition) and irritable bowel syndrome.   Weight management.   As a protective measure against hardening of the arteries (atherosclerosis), diabetes, and cancer.   GUIDELINES FOR INCREASING FIBER IN THE DIET  Start adding fiber to the diet slowly. A gradual increase of about 5 more grams (2 slices of whole-wheat bread, 2 servings of most fruits or vegetables, or 1 bowl of high-fiber cereal) per day is best. Too rapid an increase in fiber may result in  constipation, flatulence, and bloating.   Drink enough water and fluids to keep your urine clear or pale yellow. Water, juice, or caffeine-free drinks are recommended. Not drinking enough fluid may cause constipation.   Eat a variety of high-fiber foods rather than one type of fiber.   Try to increase your intake of fiber through using high-fiber foods rather than fiber pills or supplements that contain small amounts of fiber.   The goal is to change the types of food eaten. Do not supplement your present diet with high-fiber foods, but replace foods in your present diet.   INCLUDE A VARIETY OF FIBER SOURCES  Replace refined and processed grains with whole grains, canned fruits with fresh fruits, and incorporate other fiber sources. White rice, white breads, and most bakery goods contain little or no fiber.   Brown whole-grain rice, buckwheat oats, and many fruits and vegetables are all good sources of fiber. These include: broccoli, Brussels sprouts, cabbage, cauliflower, beets, sweet potatoes, white potatoes (skin on), carrots, tomatoes, eggplant, squash, berries, fresh fruits, and dried fruits.   Cereals appear to be the richest source of fiber. Cereal fiber is found in whole grains and bran. Bran is the fiber-rich outer coat of cereal grain, which is largely removed in refining. In whole-grain cereals, the bran remains. In breakfast cereals, the largest amount of fiber is found in those with "bran" in their names. The fiber content is sometimes indicated on the label.   You may need to include additional fruits and vegetables each day.   In baking, for 1 cup white flour, you may use the following substitutions:   1 cup whole-wheat flour minus 2 tablespoons.   1/2 cup white flour plus 1/2 cup whole-wheat flour.   Diverticulosis Diverticulosis is a common condition that develops when small pouches (diverticula) form in the wall of the colon. The risk of diverticulosis increases with age.  It happens more often in people who eat a low-fiber diet. Most individuals with diverticulosis have no symptoms. Those individuals with symptoms usually experience belly (abdominal) pain, constipation, or loose stools (diarrhea).  HOME CARE INSTRUCTIONS  Increase the amount of fiber in your diet as directed by your caregiver or dietician. This may reduce symptoms of diverticulosis.   Drink at least 6 to 8 glasses of water each day to prevent constipation.   Try not to strain when you have a bowel movement.   Avoiding nuts and seeds to prevent complications is NOT NECESSARY.    FOODS HAVING HIGH FIBER CONTENT INCLUDE:  Fruits. Apple, peach, pear, tangerine, raisins, prunes.   Vegetables. Brussels sprouts, asparagus, broccoli, cabbage, carrot, cauliflower, romaine lettuce, spinach, summer squash, tomato, winter squash, zucchini.   Starchy Vegetables. Baked beans, kidney beans, lima beans, split peas, lentils, potatoes (with skin).   Grains. Whole wheat bread, brown rice, bran flake cereal, plain oatmeal, white rice, shredded wheat, bran muffins.    SEEK IMMEDIATE MEDICAL CARE IF:  You develop increasing pain or severe bloating.  You have an oral temperature above 101F.   You develop vomiting or bowel movements that are bloody or black.   Hemorrhoids Hemorrhoids are dilated (enlarged) veins around the rectum. Sometimes clots will form in the veins. This makes them swollen and painful. These are called thrombosed hemorrhoids.  Causes of hemorrhoids include:  Constipation.   Straining to have a bowel movement.   HEAVY LIFTING  HOME CARE INSTRUCTIONS  Eat a well balanced diet and drink 6 to 8 glasses of water every day to avoid constipation. You may also use a bulk laxative.   Avoid straining to have bowel movements.   Keep anal area dry and clean.   Do not use a donut shaped pillow or sit on the toilet for long periods. This increases blood pooling and pain.   Move  your bowels when your body has the urge; this will require less straining and will decrease pain and pressure.

## 2016-04-11 ENCOUNTER — Encounter (HOSPITAL_COMMUNITY): Payer: Self-pay | Admitting: Gastroenterology

## 2016-06-20 ENCOUNTER — Telehealth: Payer: Self-pay | Admitting: *Deleted

## 2016-06-20 NOTE — Telephone Encounter (Signed)
OK to start a 7 day course but should still come in as well. You can prescribe. Thanks.

## 2016-06-20 NOTE — Telephone Encounter (Addendum)
Pt states he is having another gout flare and would like another steroid pack.  Dr. Ardelle AntonWagoner revised steroid pack order to Medrol 4mg  6 day pack to be taken as directed.  Orders to pt and transferred to schedulers.

## 2016-06-21 MED ORDER — METHYLPREDNISOLONE 4 MG PO TBPK: ORAL_TABLET | ORAL | Status: DC

## 2016-11-08 IMAGING — CR DG KNEE COMPLETE 4+V*L*
5 series · 5 of 5 positions shown · non-contrast
Comparison: None.

CLINICAL DATA: Pain after hitting knee on object at work 1 day
prior

EXAM:
LEFT KNEE - COMPLETE 4+ VIEW

[knee ap]
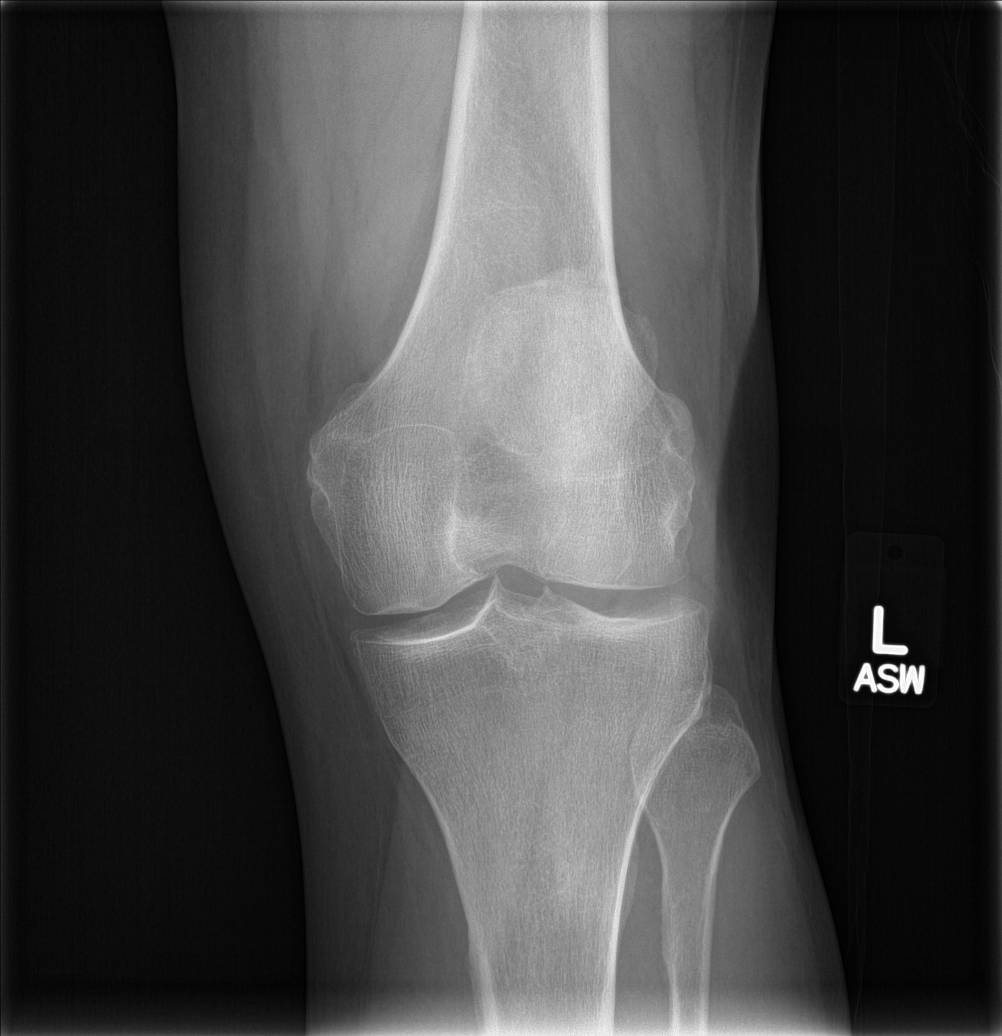

[tunnel]
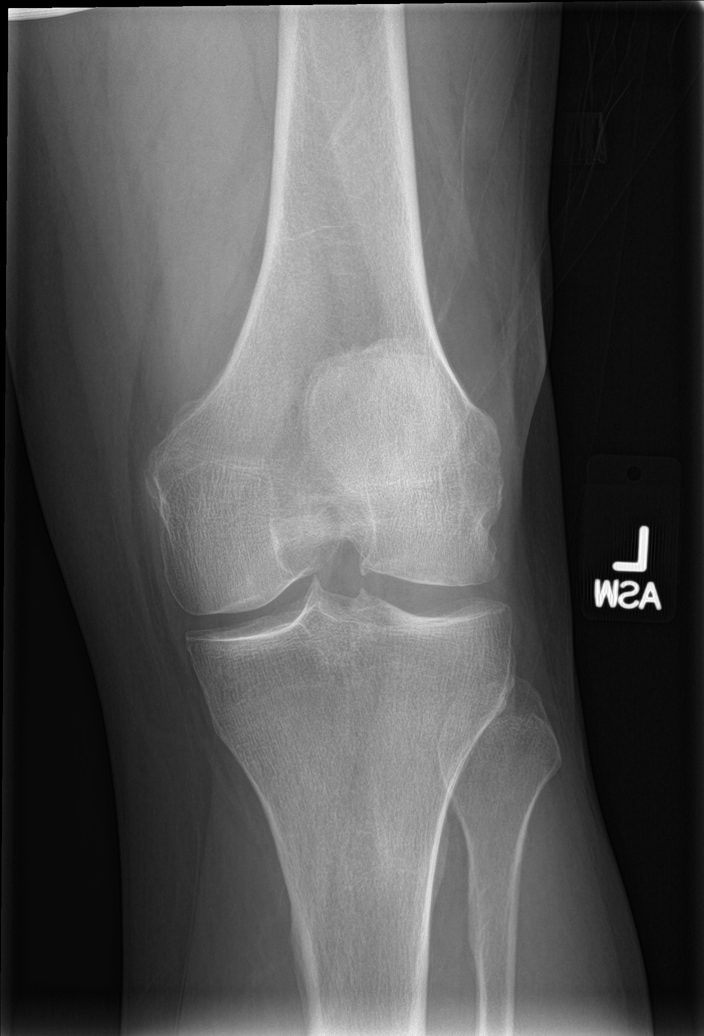

[knee lat]
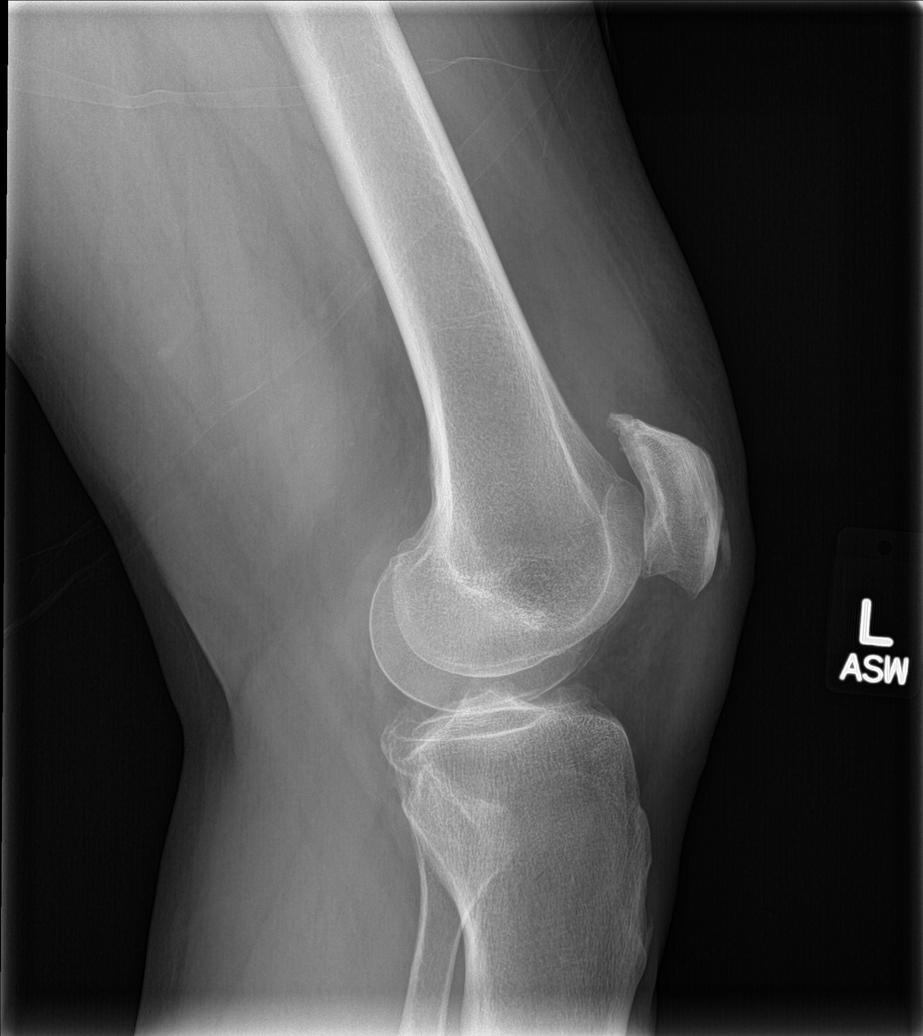

[patella skyline (1 of 2)]
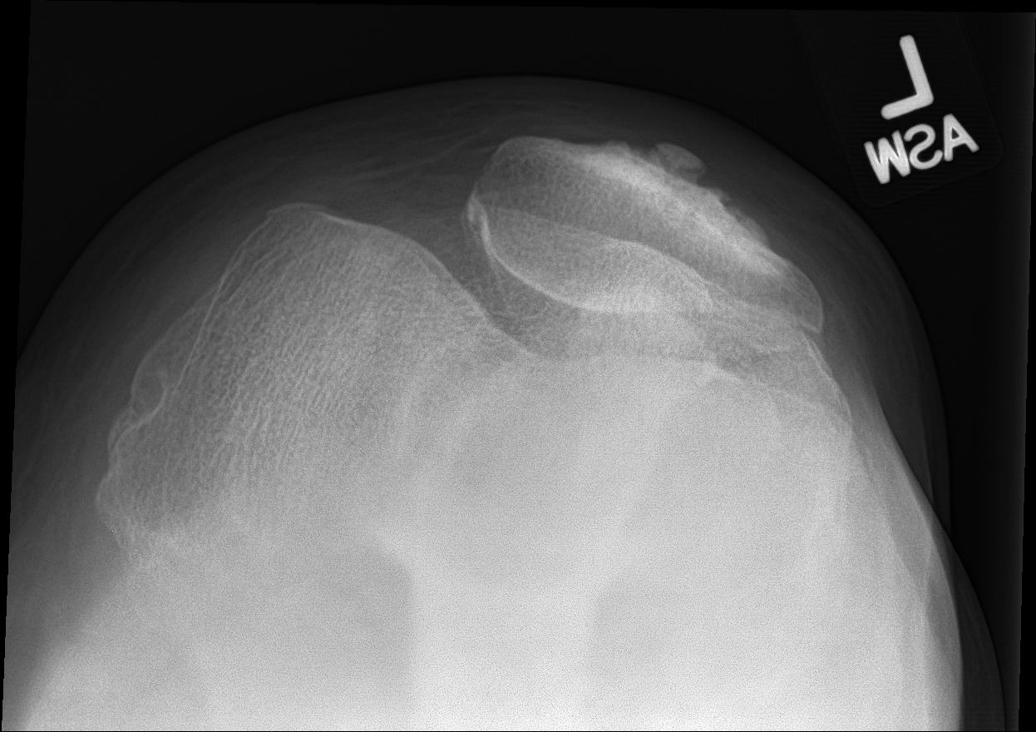

[patella skyline (2 of 2)]
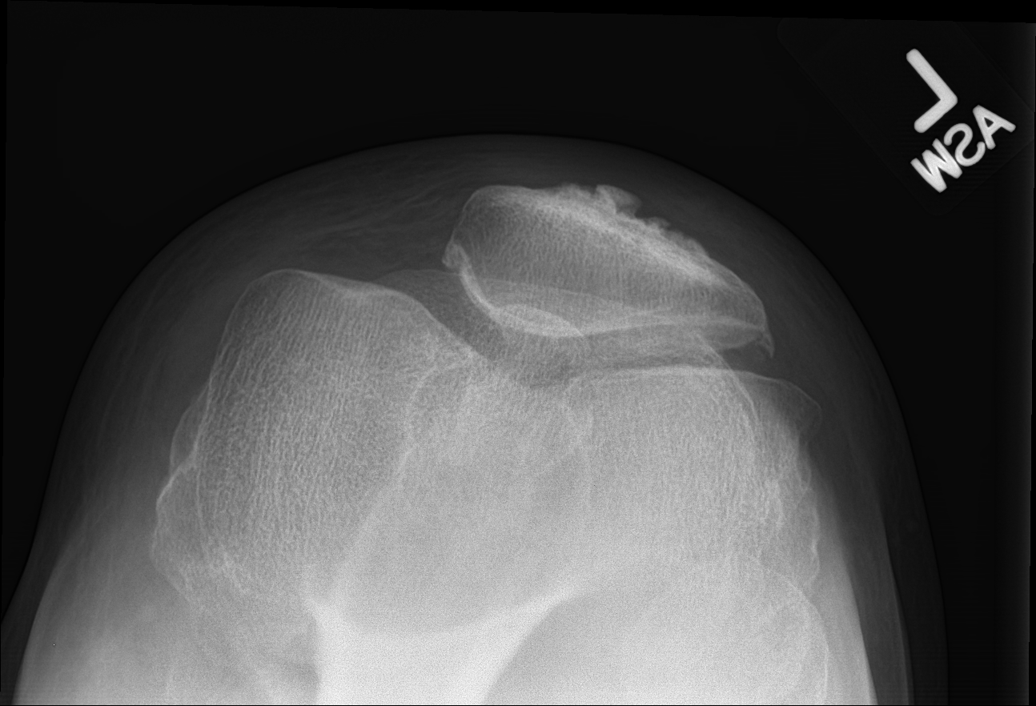

[5 of 5 positions shown; findings below may reference images not displayed]

FINDINGS: Frontal, tunnel, lateral, and sunrise patellar images were obtained.
There is no fracture or dislocation. There is a fairly small joint
effusion. There is spurring along the posterior patella. There is
slight narrowing medially. No erosive change.
IMPRESSION: Areas of osteoarthritic change. Fairly small joint effusion. No
fracture or dislocation.

## 2018-05-28 ENCOUNTER — Ambulatory Visit (INDEPENDENT_AMBULATORY_CARE_PROVIDER_SITE_OTHER): Payer: BLUE CROSS/BLUE SHIELD | Admitting: Podiatry

## 2018-05-28 ENCOUNTER — Encounter: Payer: Self-pay | Admitting: Podiatry

## 2018-05-28 ENCOUNTER — Ambulatory Visit (INDEPENDENT_AMBULATORY_CARE_PROVIDER_SITE_OTHER): Payer: BLUE CROSS/BLUE SHIELD

## 2018-05-28 DIAGNOSIS — Q828 Other specified congenital malformations of skin: Secondary | ICD-10-CM

## 2018-05-28 DIAGNOSIS — S90851A Superficial foreign body, right foot, initial encounter: Secondary | ICD-10-CM

## 2018-05-28 DIAGNOSIS — M79671 Pain in right foot: Secondary | ICD-10-CM | POA: Diagnosis not present

## 2018-06-04 NOTE — Progress Notes (Signed)
Subjective: 62 year old male presents the office today for concerns of a painful skin lesion on the bottom of his right foot.  He says is been on for about 1 to 2 months.  He is concerned because the where he works that he may have a piece of metal in his foot.  He states that there is small shards of metal that can easily get to his foot and given the callus wants to make sure.  He said no recent treatment.  Denies any swelling or redness.  Denies any drainage. Denies any systemic complaints such as fevers, chills, nausea, vomiting. No acute changes since last appointment, and no other complaints at this time.   Objective: AAO x3, NAD DP/PT pulses palpable bilaterally, CRT less than 3 seconds On the right foot submetatarsal 2 is a hyperkeratotic lesion.  Upon debridement there is no underlying ulceration, drainage or any signs of infection.  There is no evidence of any foreign body.  No other lesions identified. No open lesions or pre-ulcerative lesions.  No pain with calf compression, swelling, warmth, erythema  Assessment: Hyperkeratotic lesion right foot  Plan: -All treatment options discussed with the patient including all alternatives, risks, complications.  -X-rays obtained reviewed.  No evidence of acute fracture, calcifications or foreign body present. -I debrided the hyperkeratotic lesion to the any complications or bleeding.  Offloading.  Moisturizer to the area daily.  If symptoms continue and he is concerned about foreign body we can get an ultrasound. -Patient encouraged to call the office with any questions, concerns, change in symptoms.   Vivi BarrackMatthew R Tiyon Sanor DPM

## 2019-08-12 ENCOUNTER — Other Ambulatory Visit: Payer: Self-pay

## 2019-08-12 ENCOUNTER — Ambulatory Visit (INDEPENDENT_AMBULATORY_CARE_PROVIDER_SITE_OTHER): Payer: BC Managed Care – PPO | Admitting: Podiatry

## 2019-08-12 ENCOUNTER — Ambulatory Visit (INDEPENDENT_AMBULATORY_CARE_PROVIDER_SITE_OTHER): Payer: BC Managed Care – PPO

## 2019-08-12 DIAGNOSIS — M79671 Pain in right foot: Secondary | ICD-10-CM

## 2019-08-12 DIAGNOSIS — M7751 Other enthesopathy of right foot: Secondary | ICD-10-CM | POA: Diagnosis not present

## 2019-08-12 DIAGNOSIS — M779 Enthesopathy, unspecified: Secondary | ICD-10-CM

## 2019-08-12 DIAGNOSIS — M1000 Idiopathic gout, unspecified site: Secondary | ICD-10-CM

## 2019-08-12 MED ORDER — COLCHICINE 0.6 MG PO TABS: 0.6000 mg | ORAL_TABLET | Freq: Every day | ORAL | 0 refills | Status: DC

## 2019-08-12 NOTE — Patient Instructions (Addendum)
Colchicine tablets or capsules What is this medicine? COLCHICINE (KOL chi seen) is used to prevent or treat attacks of acute gout or gouty arthritis. This medicine is also used to treat familial Mediterranean fever. This medicine may be used for other purposes; ask your health care provider or pharmacist if you have questions. COMMON BRAND NAME(S): Colcrys, MITIGARE What should I tell my health care provider before I take this medicine? They need to know if you have any of these conditions:  kidney disease  liver disease  an unusual or allergic reaction to colchicine, other medicines, foods, dyes, or preservatives  pregnant or trying to get pregnant  breast-feeding How should I use this medicine? Take this medicine by mouth with a full glass of water. Follow the directions on the prescription label. You can take it with or without food. If it upsets your stomach, take it with food. Take your medicine at regular intervals. Do not take your medicine more often than directed. A special MedGuide will be given to you by the pharmacist with each prescription and refill. Be sure to read this information carefully each time. Talk to your pediatrician regarding the use of this medicine in children. While this drug may be prescribed for children as young as 18 years old for selected conditions, precautions do apply. Patients over 31 years old may have a stronger reaction and need a smaller dose. Overdosage: If you think you have taken too much of this medicine contact a poison control center or emergency room at once. NOTE: This medicine is only for you. Do not share this medicine with others. What if I miss a dose? If you miss a dose, take it as soon as you can. If it is almost time for your next dose, take only that dose. Do not take double or extra doses. What may interact with this medicine? Do not take this medicine with any of the following medications:  certain antivirals for HIV or  hepatitis This medicine may also interact with the following medications:  certain antibiotics like erythromycin or clarithromycin  certain medicines for blood pressure, heart disease, irregular heart beat  certain medicines for cholesterol like atorvastatin, lovastatin, and simvastatin  certain medicines for fungal infections like ketoconazole, itraconazole, or posaconazole  cyclosporine  grapefruit or grapefruit juice This list may not describe all possible interactions. Give your health care provider a list of all the medicines, herbs, non-prescription drugs, or dietary supplements you use. Also tell them if you smoke, drink alcohol, or use illegal drugs. Some items may interact with your medicine. What should I watch for while using this medicine? Visit your healthcare professional for regular checks on your progress. Tell your healthcare professional if your symptoms do not start to get better or if they get worse. You should make sure you get enough vitamin B12 while you are taking this medicine. Discuss the foods you eat and the vitamins you take with your healthcare professional. This medicine may increase your risk to bruise or bleed. Call you healthcare professional if you notice any unusual bleeding. What side effects may I notice from receiving this medicine? Side effects that you should report to your doctor or health care professional as soon as possible:  allergic reactions like skin rash, itching or hives, swelling of the face, lips, or tongue  low blood counts - this medicine may decrease the number of white blood cells, red blood cells, and platelets. You may be at increased risk for infections and bleeding  pain, tingling, numbness in the hands or feet  severe diarrhea  signs and symptoms of infection like fever; chills; cough; sore throat; pain or trouble passing urine  signs and symptoms of muscle injury like dark urine; trouble passing urine or change in the  amount of urine; unusually weak or tired; muscle pain; back pain  unusual bleeding or bruising  unusually weak or tired  vomiting Side effects that usually do not require medical attention (report these to your doctor or health care professional if they continue or are bothersome):  mild diarrhea  nausea  stomach pain This list may not describe all possible side effects. Call your doctor for medical advice about side effects. You may report side effects to FDA at 1-800-FDA-1088. Where should I keep my medicine? Keep out of the reach of children. Store at room temperature between 15 and 30 degrees C (59 and 86 degrees F). Keep container tightly closed. Protect from light. Throw away any unused medicine after the expiration date. NOTE: This sheet is a summary. It may not cover all possible information. If you have questions about this medicine, talk to your doctor, pharmacist, or health care provider.  2020 Elsevier/Gold Standard (2018-02-14 18:45:11)  Gout  Gout is painful swelling of your joints. Gout is a type of arthritis. It is caused by having too much uric acid in your body. Uric acid is a chemical that is made when your body breaks down substances called purines. If your body has too much uric acid, sharp crystals can form and build up in your joints. This causes pain and swelling. Gout attacks can happen quickly and be very painful (acute gout). Over time, the attacks can affect more joints and happen more often (chronic gout). What are the causes?  Too much uric acid in your blood. This can happen because: ? Your kidneys do not remove enough uric acid from your blood. ? Your body makes too much uric acid. ? You eat too many foods that are high in purines. These foods include organ meats, some seafood, and beer.  Trauma or stress. What increases the risk?  Having a family history of gout.  Being male and middle-aged.  Being male and having gone through menopause.   Being very overweight (obese).  Drinking alcohol, especially beer.  Not having enough water in the body (being dehydrated).  Losing weight too quickly.  Having an organ transplant.  Having lead poisoning.  Taking certain medicines.  Having kidney disease.  Having a skin condition called psoriasis. What are the signs or symptoms? An attack of acute gout usually happens in just one joint. The most common place is the big toe. Attacks often start at night. Other joints that may be affected include joints of the feet, ankle, knee, fingers, wrist, or elbow. Symptoms of an attack may include:  Very bad pain.  Warmth.  Swelling.  Stiffness.  Shiny, red, or purple skin.  Tenderness. The affected joint may be very painful to touch.  Chills and fever. Chronic gout may cause symptoms more often. More joints may be involved. You may also have white or yellow lumps (tophi) on your hands or feet or in other areas near your joints. How is this treated?  Treatment for this condition has two phases: treating an acute attack and preventing future attacks.  Acute gout treatment may include: ? NSAIDs. ? Steroids. These are taken by mouth or injected into a joint. ? Colchicine. This medicine relieves pain and swelling. It can  be given by mouth or through an IV tube.  Preventive treatment may include: ? Taking small doses of NSAIDs or colchicine daily. ? Using a medicine that reduces uric acid levels in your blood. ? Making changes to your diet. You may need to see a food expert (dietitian) about what to eat and drink to prevent gout. Follow these instructions at home: During a gout attack   If told, put ice on the painful area: ? Put ice in a plastic bag. ? Place a towel between your skin and the bag. ? Leave the ice on for 20 minutes, 2-3 times a day.  Raise (elevate) the painful joint above the level of your heart as often as you can.  Rest the joint as much as possible. If the  joint is in your leg, you may be given crutches.  Follow instructions from your doctor about what you cannot eat or drink. Avoiding future gout attacks  Eat a low-purine diet. Avoid foods and drinks such as: ? Liver. ? Kidney. ? Anchovies. ? Asparagus. ? Herring. ? Mushrooms. ? Mussels. ? Beer.  Stay at a healthy weight. If you want to lose weight, talk with your doctor. Do not lose weight too fast.  Start or continue an exercise plan as told by your doctor. Eating and drinking  Drink enough fluids to keep your pee (urine) pale yellow.  If you drink alcohol: ? Limit how much you use to:  0-1 drink a day for women.  0-2 drinks a day for men. ? Be aware of how much alcohol is in your drink. In the [place] one drink equals one 12 oz bottle of beer (355 mL), one 5 oz glass of wine (148 mL), or one 1 oz glass of hard liquor (44 mL). General instructions  Take over-the-counter and prescription medicines only as told by your doctor.  Do not drive or use heavy machinery while taking prescription pain medicine.  Return to your normal activities as told by your doctor. Ask your doctor what activities are safe for you.  Keep all follow-up visits as told by your doctor. This is important. Contact a doctor if:  You have another gout attack.  You still have symptoms of a gout attack after 10 days of treatment.  You have problems (side effects) because of your medicines.  You have chills or a fever.  You have burning pain when you pee (urinate).  You have pain in your lower back or belly. Get help right away if:  You have very bad pain.  Your pain cannot be controlled.  You cannot pee. Summary  Gout is painful swelling of the joints.  The most common site of pain is the big toe, but it can affect other joints.  Medicines and avoiding some foods can help to prevent and treat gout attacks. This information is not intended to replace advice given to you by your health  care provider. Make sure you discuss any questions you have with your health care provider. Document Released: 09/06/2008 Document Revised: 06/20/2018 Document Reviewed: 06/20/2018 Elsevier Patient Education  2020 [employer].

## 2019-08-12 NOTE — Progress Notes (Signed)
Subjective: 63 year old male presents the office for concerns of gout to the right big toe which has been ongoing for the last week.  States this started about 2 weeks ago but worse about 1 week ago.  He states that he is work on his diet quite a bit and still getting gout flares.  Denies any recent injury or trauma to his feet.  He has had no recent treatment other than try to drink cherry juice. Denies any systemic complaints such as fevers, chills, nausea, vomiting. No acute changes since last appointment, and no other complaints at this time.   Objective: AAO x3, NAD DP/PT pulses palpable bilaterally, CRT less than 3 seconds There is pain on the right first MPJ and there is localized edema and erythema.  There is pain with MPJ range of motion.  There is no fluctuation crepitation any open sores.  No open lesions or pre-ulcerative lesions.  No pain with calf compression, swelling, warmth, erythema  Assessment: Right first MTPJ capsulitis, gait  Plan: -All treatment options discussed with the patient including all alternatives, risks, complications.  -X-rays obtained reviewed.  No evidence of acute fracture.  Arthritic changes present. -Steroid injection performed.  See procedure note below. -Check uric acid level, BMP. -Discussed possible allopurinol given recurrence of gout. -Patient encouraged to call the office with any questions, concerns, change in symptoms.   Procedure: Injection small joint Discussed alternatives, risks, complications and verbal consent was obtained.  Location: Right first MPJ Skin Prep: Betadine Injectate: 0.5cc 0.5% marcaine plain, 0.5 cc 2% lidocaine plain and, 1 cc kenalog 10. Disposition: Patient tolerated procedure well. Injection site dressed with a band-aid.  Post-injection care was discussed and return precautions discussed.   Trula Slade DPM

## 2019-08-13 ENCOUNTER — Other Ambulatory Visit: Payer: Self-pay | Admitting: Podiatry

## 2019-08-13 DIAGNOSIS — M1000 Idiopathic gout, unspecified site: Secondary | ICD-10-CM

## 2019-08-15 LAB — BASIC METABOLIC PANEL
BUN: 13 mg/dL (ref 7–25)
CO2: 29 mmol/L (ref 20–32)
Calcium: 9.7 mg/dL (ref 8.6–10.3)
Chloride: 105 mmol/L (ref 98–110)
Creat: 1.08 mg/dL (ref 0.70–1.25)
Glucose, Bld: 114 mg/dL — ABNORMAL HIGH (ref 65–99)
Potassium: 3.4 mmol/L — ABNORMAL LOW (ref 3.5–5.3)
Sodium: 142 mmol/L (ref 135–146)

## 2019-08-15 LAB — URIC ACID: Uric Acid, Serum: 7.6 mg/dL (ref 4.0–8.0)

## 2019-08-16 ENCOUNTER — Telehealth: Payer: Self-pay | Admitting: *Deleted

## 2019-08-16 MED ORDER — ALLOPURINOL 100 MG PO TABS: 100.0000 mg | ORAL_TABLET | Freq: Every day | ORAL | 0 refills | Status: DC

## 2019-08-16 NOTE — Telephone Encounter (Signed)
-----   Message from Trula Slade, DPM sent at 08/16/2019 10:15 AM EDT ----- VAl- please let him know that the blood work is normal. I do still think it is gout. Can you see how he did from the injection and colchicine? I do think due to the repeated gout attacks we should start allopurinol 100mg  daily.

## 2019-08-16 NOTE — Telephone Encounter (Signed)
Left message on [facility] phone requesting pt call for results and instructions.

## 2019-08-16 NOTE — Telephone Encounter (Signed)
I spoke with pt and informed of Dr. Leigh Aurora review of results and orders. Pt states he is feeling some better.

## 2019-12-23 ENCOUNTER — Telehealth: Payer: Self-pay | Admitting: *Deleted

## 2019-12-23 ENCOUNTER — Other Ambulatory Visit: Payer: Self-pay | Admitting: Podiatry

## 2019-12-23 MED ORDER — COLCHICINE 0.6 MG PO TABS: 0.6000 mg | ORAL_TABLET | Freq: Every day | ORAL | 0 refills | Status: DC

## 2019-12-23 NOTE — Telephone Encounter (Signed)
Pt called to see if his message had been received. I told him I had sent his message to Dr. Ardelle Anton and to check the pharmacy later this evening.

## 2019-12-23 NOTE — Telephone Encounter (Signed)
I tried to call the patient to discuss what was going on. I tried 2 times and left VM to call back. I did go ahead and send over colchicine and requested him to call me tomorrow so we can discuss.

## 2019-12-23 NOTE — Telephone Encounter (Signed)
Pt states he feels he is having another gout flare.

## 2019-12-24 NOTE — Telephone Encounter (Signed)
Unable to leave a message on pt's home phone voice mailbox was full. Left message on pt's mobile phone informing that Dr. Ardelle Anton had called to discuss his current problem, and had called in colchicine, but if pt symptoms worsened or had another flare of symptoms he would need to make an appt to be evaluated by Dr. Ardelle Anton.

## 2020-01-02 NOTE — Telephone Encounter (Signed)
Pt states he is returning a call, his phone has been messed up and he will call again if he has anymore foot trouble.

## 2020-01-13 ENCOUNTER — Other Ambulatory Visit: Payer: Self-pay

## 2020-01-13 ENCOUNTER — Ambulatory Visit (INDEPENDENT_AMBULATORY_CARE_PROVIDER_SITE_OTHER): Payer: BC Managed Care – PPO | Admitting: Podiatry

## 2020-01-13 ENCOUNTER — Ambulatory Visit (INDEPENDENT_AMBULATORY_CARE_PROVIDER_SITE_OTHER): Payer: BC Managed Care – PPO

## 2020-01-13 DIAGNOSIS — M779 Enthesopathy, unspecified: Secondary | ICD-10-CM | POA: Diagnosis not present

## 2020-01-13 DIAGNOSIS — M79671 Pain in right foot: Secondary | ICD-10-CM

## 2020-01-13 DIAGNOSIS — M778 Other enthesopathies, not elsewhere classified: Secondary | ICD-10-CM | POA: Diagnosis not present

## 2020-01-13 DIAGNOSIS — M10371 Gout due to renal impairment, right ankle and foot: Secondary | ICD-10-CM

## 2020-01-13 MED ORDER — COLCHICINE 0.6 MG PO TABS: 0.6000 mg | ORAL_TABLET | Freq: Every day | ORAL | 0 refills | Status: DC

## 2020-01-13 MED ORDER — ALLOPURINOL 100 MG PO TABS: 100.0000 mg | ORAL_TABLET | Freq: Every day | ORAL | 0 refills | Status: DC

## 2020-01-14 ENCOUNTER — Encounter: Payer: Self-pay | Admitting: Podiatry

## 2020-01-14 NOTE — Progress Notes (Signed)
Subjective:  Patient ID: Henry Silva, male    DOB: 1956/02/21,  MRN: [id number]  Chief Complaint  Patient presents with  . Gout    pt with possible gout of the right foot from the second to third toe, pain has been on and off for about 2-3 years, pt also states that pain comes back when he stops taking gout medication.    64 y.o. male presents with the above complaint.  Patient presents with right second and third metatarsophalangeal joint capsulitis/gout flareup.  Patient states he had stopped taking his allopurinol and the pain came back.  He has tried taking colchicine which has not helped.  He was last seen by Dr. Earleen Newport for first MPJ gout flare.  He has not seen any rheumatologist for this.  He denies any other acute complaints.  He states that he does have a diet and red meat but has not consumed too much red meat at all.  He denies any alcohol consumption.   Review of Systems: Negative except as noted in the HPI. Denies N/V/F/Ch.  Past Medical History:  Diagnosis Date  . Medical history non-contributory     Current Outpatient Medications:  .  allopurinol (ZYLOPRIM) 100 MG tablet, Take 1 tablet (100 mg total) by mouth daily., Disp: 30 tablet, Rfl: 0 .  colchicine 0.6 MG tablet, Take 1 tablet (0.6 mg total) by mouth daily., Disp: 10 tablet, Rfl: 0 .  Ibuprofen (ADVIL) 200 MG CAPS, Take 600 mg by mouth daily as needed (pain). , Disp: , Rfl:  .  methylPREDNISolone (MEDROL) 4 MG TBPK tablet, Take as directed., Disp: 21 tablet, Rfl: 0 .  Na Sulfate-K Sulfate-Mg Sulf (SUPREP BOWEL PREP) SOLN, Take 1 kit by mouth as directed., Disp: 1 Bottle, Rfl: 0  Social History   Tobacco Use  Smoking Status Former Smoker  . Packs/day: 0.50  . Years: 15.00  . Pack years: 7.50  . Types: Cigarettes  . Quit date: 03/08/1994  . Years since quitting: 25.8  Smokeless Tobacco Never Used    Allergies  Allergen Reactions  . Neosporin Lt [Allantoin-Pramoxine]    Objective:  There were no  vitals filed for this visit. There is no height or weight on file to calculate BMI. Constitutional Well developed. Well nourished.  Vascular Dorsalis pedis pulses palpable bilaterally. Posterior tibial pulses palpable bilaterally. Capillary refill normal to all digits.  No cyanosis or clubbing noted. Pedal hair growth normal.  Neurologic Normal speech. Oriented to person, place, and time. Epicritic sensation to light touch grossly present bilaterally.  Dermatologic Nails well groomed and normal in appearance. No open wounds. No skin lesions.  Orthopedic:  Pain on palpation to the right second metatarsophalangeal joint and third metatarsophalangeal joint.  Pain with range of motion of the second and third metatarsophalangeal joint.  No pain at the first metatarsophalangeal joint with active and passive range of motion or palpation.   Radiographs: 3 views of skeletally mature right foot noted: Mild arthritic changes noted at the first metatarsophalangeal joint.  No other bony abnormalities identified.  Mild hammertoe contractures noted of digits 2 through 5.  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  Mild anterior ankle arthritic changes noted.  Heel spur noted. Assessment:   1. 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 Foot pain, right    Plan:  Patient was evaluated and treated and all questions answered.  Right second metatarsophalangeal joint/third metatarsophalangeal joint capsulitis/gout flare -I  explained to the patient the etiology of capsulitis/gout and various treatment options associated with it.  I explained to the patient that gout flareup generally tends to occur in the first metatarsophalangeal joint as opposed to lesser metatarsophalangeal joint and this is likely capsulitis.  However given that patient has run out of his allopurinol medication and his pain resumed after he was off of it I believe patient will benefit  from another 30-day course of allopurinol and a rheumatology follow-up for long-term management of gout flare. -Patient will be scheduled to see a rheumatologist for outpatient follow-up of gout and long-term management. -As for the capsulitis I believe patient will benefit from a steroid injection into the second and third metatarsophalangeal joint to decrease the acute inflammation.  Patient states understanding and would like to proceed with injection. -Allopurinol and colchicine was dispensed -A steroid injection was performed at right second and third metatarsophalangeal joint x2 using 1% plain Lidocaine and 10 mg of Kenalog. This was well tolerated.   No follow-ups on file.

## 2020-01-14 NOTE — Patient Instructions (Signed)
Capsulitis

## 2020-01-17 ENCOUNTER — Other Ambulatory Visit: Payer: Self-pay | Admitting: Podiatry

## 2020-01-17 DIAGNOSIS — M10371 Gout due to renal impairment, right ankle and foot: Secondary | ICD-10-CM

## 2020-01-20 ENCOUNTER — Telehealth: Payer: Self-pay | Admitting: *Deleted

## 2020-01-20 DIAGNOSIS — M10371 Gout due to renal impairment, right ankle and foot: Secondary | ICD-10-CM

## 2020-01-20 NOTE — Telephone Encounter (Signed)
Required form, clinicals and demographics to [facility]. 

## 2020-01-20 NOTE — Telephone Encounter (Signed)
-----   Message from Candelaria Stagers, DPM sent at 01/14/2020 11:11 AM EST ----- Regarding: Rheumatology follow-up Hi Drea Jurewicz,  Can you have this patient follow-up with a rheumatologist for long-term management of gout.  Thanks

## 2020-01-20 NOTE — Addendum Note (Signed)
Addended by: Alphia Kava D on: 01/20/2020 01:11 PM   Modules accepted: Orders

## 2020-02-14 ENCOUNTER — Ambulatory Visit: Payer: BC Managed Care – PPO | Admitting: Podiatry

## 2020-02-14 ENCOUNTER — Other Ambulatory Visit: Payer: Self-pay

## 2020-02-14 DIAGNOSIS — M7752 Other enthesopathy of left foot: Secondary | ICD-10-CM

## 2020-02-14 DIAGNOSIS — M10371 Gout due to renal impairment, right ankle and foot: Secondary | ICD-10-CM

## 2020-02-14 MED ORDER — ALLOPURINOL 100 MG PO TABS: 100.0000 mg | ORAL_TABLET | Freq: Every day | ORAL | 0 refills | Status: AC

## 2020-02-14 MED ORDER — COLCHICINE 0.6 MG PO TABS: 0.6000 mg | ORAL_TABLET | Freq: Every day | ORAL | 0 refills | Status: AC

## 2020-02-18 ENCOUNTER — Encounter: Payer: Self-pay | Admitting: Podiatry

## 2020-02-18 NOTE — Progress Notes (Signed)
Subjective:  Patient ID: Henry Silva, male    DOB: 06-18-1956,  MRN: [id number]  Chief Complaint  Patient presents with  . Gout    pt is here for a left foot gout f/u pt states that the left foot has a burning sensation to it, and states that the left foot is elevated when applying pressure to it.    64 y.o. male presents with the above complaint.  Patient presents with the left gout acute pain at the first MPJ.  Patient states last time he was here he was on the right foot.  He does not have any pain on the right side.  He states he ran out of his allopurinol as well as colchicine medication.  As soon as he ran out his pain started back up again.  Patient states it is a hot feeling to it.  Patient states it is similar to his right foot.  Pain scale 7 out of 10.  Patient does not states that he does not need an x-ray as he knows this is gout.  Series refusing x-rays.  He denies any other acute complaints.  Patient states that he has a rheumatology follow-up this month.   Review of Systems: Negative except as noted in the HPI. Denies N/V/F/Ch.  Past Medical History:  Diagnosis Date  . Medical history non-contributory     Current Outpatient Medications:  .  allopurinol (ZYLOPRIM) 100 MG tablet, Take 1 tablet (100 mg total) by mouth daily., Disp: 30 tablet, Rfl: 0 .  colchicine 0.6 MG tablet, Take 1 tablet (0.6 mg total) by mouth daily., Disp: 10 tablet, Rfl: 0 .  Ibuprofen (ADVIL) 200 MG CAPS, Take 600 mg by mouth daily as needed (pain). , Disp: , Rfl:  .  methylPREDNISolone (MEDROL) 4 MG TBPK tablet, Take as directed., Disp: 21 tablet, Rfl: 0 .  Na Sulfate-K Sulfate-Mg Sulf (SUPREP BOWEL PREP) SOLN, Take 1 kit by mouth as directed., Disp: 1 Bottle, Rfl: 0  Social History   Tobacco Use  Smoking Status Former Smoker  . Packs/day: 0.50  . Years: 15.00  . Pack years: 7.50  . Types: Cigarettes  . Quit date: 03/08/1994  . Years since quitting: 25.9  Smokeless Tobacco Never Used     Allergies  Allergen Reactions  . Neosporin Lt [Allantoin-Pramoxine]    Objective:  There were no vitals filed for this visit. There is no height or weight on file to calculate BMI. Constitutional Well developed. Well nourished.  Vascular Dorsalis pedis pulses palpable bilaterally. Posterior tibial pulses palpable bilaterally. Capillary refill normal to all digits.  No cyanosis or clubbing noted. Pedal hair growth normal.  Neurologic Normal speech. Oriented to person, place, and time. Epicritic sensation to light touch grossly present bilaterally.  Dermatologic Nails well groomed and normal in appearance. No open wounds. No skin lesions.  Orthopedic:  No pain on palpation to the right second metatarsophalangeal joint and third metatarsophalangeal joint.  No pain with range of motion of the second and third metatarsophalangeal joint.  No pain at the first metatarsophalangeal joint with active and passive range of motion or palpation.  Pain on palpation to the left first metatarsophalangeal joint.  Pain with range of motion active and passive of the first MPJ.  Intra-articular pain noted.   Radiographs: None Assessment:   No diagnosis found. Plan:  Patient was evaluated and treated and all questions answered.  Right second metatarsophalangeal joint/third metatarsophalangeal joint capsulitis/gout flare -I explained to the patient the etiology  of capsulitis/gout and various treatment options associated with it.  I explained to the patient that gout flareup generally tends to occur in the first metatarsophalangeal joint as opposed to lesser metatarsophalangeal joint and this is likely capsulitis.  However given that patient has run out of his allopurinol medication and his pain resumed after he was off of it I believe patient will benefit from another 30-day course of allopurinol and a rheumatology follow-up for long-term management of gout flare. -Patient will be scheduled to see a  rheumatologist for outpatient follow-up of gout and long-term management. -As for the capsulitis I believe patient will benefit from a steroid injection left first metatarsophalangeal joint to decrease the acute inflammation.  Patient states understanding and would like to proceed with injection. -Allopurinol and colchicine was dispensed -A steroid injection was performed at left first metatarsophalangeal joint using 1% plain Lidocaine and 10 mg of Kenalog. This was well tolerated.   No follow-ups on file.

## 2020-02-27 ENCOUNTER — Telehealth: Payer: Self-pay | Admitting: *Deleted

## 2020-02-27 NOTE — Telephone Encounter (Signed)
[facility] - Victorino Dike states pt arrived 9:00am for 8:30am appt and was asked to reschedule, pt refused. Victorino Dike states pt states he was lost.

## 2020-04-06 DIAGNOSIS — M1A00X0 Idiopathic chronic gout, unspecified site, without tophus (tophi): Secondary | ICD-10-CM | POA: Insufficient documentation

## 2020-04-10 ENCOUNTER — Ambulatory Visit: Payer: BC Managed Care – PPO | Admitting: Podiatry

## 2020-04-16 DIAGNOSIS — M19041 Primary osteoarthritis, right hand: Secondary | ICD-10-CM | POA: Insufficient documentation

## 2020-04-16 DIAGNOSIS — R768 Other specified abnormal immunological findings in serum: Secondary | ICD-10-CM | POA: Insufficient documentation

## 2021-06-21 DIAGNOSIS — M05741 Rheumatoid arthritis with rheumatoid factor of right hand without organ or systems involvement: Secondary | ICD-10-CM | POA: Insufficient documentation

## 2023-08-02 ENCOUNTER — Other Ambulatory Visit (INDEPENDENT_AMBULATORY_CARE_PROVIDER_SITE_OTHER): Payer: Medicare Other

## 2023-08-02 ENCOUNTER — Ambulatory Visit: Payer: Medicare Other | Admitting: Orthopedic Surgery

## 2023-08-02 ENCOUNTER — Encounter: Payer: Self-pay | Admitting: Orthopedic Surgery

## 2023-08-02 VITALS — BP 149/93 | HR 84 | Ht 73.0 in | Wt 212.0 lb

## 2023-08-02 DIAGNOSIS — M25552 Pain in left hip: Secondary | ICD-10-CM

## 2023-08-02 DIAGNOSIS — M79605 Pain in left leg: Secondary | ICD-10-CM

## 2023-08-02 DIAGNOSIS — M545 Low back pain, unspecified: Secondary | ICD-10-CM | POA: Diagnosis not present

## 2023-08-02 MED ORDER — CYCLOBENZAPRINE HCL 10 MG PO TABS: 10.0000 mg | ORAL_TABLET | Freq: Two times a day (BID) | ORAL | 0 refills | Status: AC | PRN

## 2023-08-02 MED ORDER — PREDNISONE 10 MG (21) PO TBPK: ORAL_TABLET | ORAL | 0 refills | Status: AC

## 2023-08-02 NOTE — Progress Notes (Signed)
New Patient Visit  Assessment: Henry Silva is a 67 y.o. male with the following: 1. Lumbar pain with radiation down left leg 2.  Left hip pain   Plan: Henry Silva has pain in the lower back, radiating into the left buttock.  Occasional pains radiating in the posterior aspect of the left leg to his knee.  He also has pain in the groin.  On physical exam, some of the groin pain can be recreated with internal and external rotation of the left hip.  Radiographs demonstrate degenerative changes throughout the lumbar spine.  Mild to moderate degenerative change in the left hip.  Overall, his pain is gradually getting better.  We discussed proceeding with a left hip steroid injection, but this may not affect the pain he is having in his back.  As such, I have recommended a prednisone Dosepak, as well as some Flexeril.  I would like to see him back in 3 weeks for repeat evaluation.  Follow-up: Return in about 3 weeks (around 08/23/2023).  Subjective:  Chief Complaint  Patient presents with   Back Pain    LBP w/ L hip pain for 2 mos no known injury but pain hasn't gotten better after seeing a chiropractor and pain is in a different spot.     History of Present Illness: Henry Silva is a 67 y.o. male who has been referred by Reatha Harps, DC for evaluation of back pain.  He has had pain in the lower back, primarily on the left side, for the past 2 months.  No specific injury.  More recently, the pain is started to radiate into the front of his hip, and into the groin.  He does have some pain radiating in the left buttock, distally into the left leg.  He describes a catching type pain, consistent with spasm.  Occasionally, his leg will start to shake.  Over-the-counter medications have not been effective.  He has been getting some chiropractic treatments.   Review of Systems: No fevers or chills No numbness or tingling No chest pain No shortness of breath No bowel or bladder  dysfunction No GI distress No headaches   Medical History:  Past Medical History:  Diagnosis Date   Medical history non-contributory     Past Surgical History:  Procedure Laterality Date   APPENDECTOMY     COLONOSCOPY N/A 04/08/2016   Procedure: COLONOSCOPY;  Surgeon: West Bali, MD;  Location: [facility];  Service: Endoscopy;  Laterality: N/A;  9:30 AM- moved to 1045 office to notify   Left knee arthroscopy     X 2    No family history on file. Social History   Tobacco Use   Smoking status: Former    Current packs/day: 0.00    Average packs/day: 0.5 packs/day for 15.0 years (7.5 ttl pk-yrs)    Types: Cigarettes    Start date: 03/09/1979    Quit date: 03/08/1994    Years since quitting: 29.4   Smokeless tobacco: Never  Substance Use Topics   Alcohol use: No   Drug use: No    Allergies  Allergen Reactions   Neosporin Lt [Allantoin-Pramoxine]     Current Meds  Medication Sig   cyclobenzaprine (FLEXERIL) 10 MG tablet Take 1 tablet (10 mg total) by mouth 2 (two) times daily as needed.   predniSONE (STERAPRED UNI-PAK 21 TAB) 10 MG (21) TBPK tablet 10 mg DS 12 as directed    Objective: BP (!) 149/93   Pulse  84   Ht 6\' 1"  (1.854 m)   Wt 212 lb (96.2 kg)   BMI 27.97 kg/m   Physical Exam:  General: Alert and oriented. and No acute distress. Gait: Left sided antalgic gait.  Lower back present tenderness, left worse than right.  Negative straight leg raise bilaterally.  He has good lower body strength.  2+ patellar tendon reflexes.  Pain in the anterior hip with internal and external rotation of the left hip.  IMAGING: I personally ordered and reviewed the following images  X-rays of the lumbar spine were obtained in clinic today.  No acute injuries are noted.  No evidence of anterolisthesis.  Diffuse degenerative changes, with anterior osteophytes.  No bony lesions.  Impression: Lumbar spine x-ray with diffuse degenerative changes.   X-rays of the  left hip were obtained in clinic today.  No acute injuries noted.  Mild to moderate loss of joint space, with minimal osteophytes.  No evidence of AVN.  No bony lesions.  Impression: Left hip x-ray with mild to moderate degenerative changes.   New Medications:  Meds ordered this encounter  Medications   predniSONE (STERAPRED UNI-PAK 21 TAB) 10 MG (21) TBPK tablet    Sig: 10 mg DS 12 as directed    Dispense:  48 tablet    Refill:  0   cyclobenzaprine (FLEXERIL) 10 MG tablet    Sig: Take 1 tablet (10 mg total) by mouth 2 (two) times daily as needed.    Dispense:  20 tablet    Refill:  0      Oliver Barre, MD  08/02/2023 12:55 PM

## 2023-08-23 ENCOUNTER — Ambulatory Visit: Payer: Medicare Other | Admitting: Orthopedic Surgery

## 2023-08-23 ENCOUNTER — Encounter: Payer: Self-pay | Admitting: Orthopedic Surgery

## 2023-08-23 VITALS — BP 131/89 | HR 78 | Ht 73.0 in | Wt 209.0 lb

## 2023-08-23 DIAGNOSIS — M545 Low back pain, unspecified: Secondary | ICD-10-CM | POA: Diagnosis not present

## 2023-08-23 DIAGNOSIS — M25552 Pain in left hip: Secondary | ICD-10-CM

## 2023-08-23 DIAGNOSIS — M79605 Pain in left leg: Secondary | ICD-10-CM

## 2023-08-23 NOTE — Progress Notes (Signed)
Return patient Visit  Assessment: Henry Silva is a 67 y.o. male with the following: 1. Lumbar pain with radiation down left leg 2.  Left hip pain   Plan: Henry Silva is doing much better.  He reports that his pain is 90% better.  He has completed his prednisone.  Urged him to continue to remain active.  Be careful, and avoid doing too much.  If his pain returns, or he needs anything else, he will return to clinic.  No restrictions.  Follow-up as needed.  Follow-up: Return if symptoms worsen or fail to improve.  Subjective:  Chief Complaint  Patient presents with   Hip Pain    Fu hip and back  patient says 90% better that before     History of Present Illness: Henry Silva is a 67 y.o. male who returns to clinic for repeat evaluation of his lower back pain.  I saw him in clinic recently, and provided him with a prednisone Dosepak.  He states that he has occasional pain in the anterior hip and groin area, but otherwise is doing much better.  His pain is 90% better than it was prior to when I saw him.  No numbness or tingling distally.  He is continuing to activities cautiously   Review of Systems: No fevers or chills No numbness or tingling No chest pain No shortness of breath No bowel or bladder dysfunction No GI distress No headaches    Objective: BP 131/89   Pulse 78   Ht 6\' 1"  (1.854 m)   Wt 209 lb (94.8 kg)   BMI 27.57 kg/m   Physical Exam:  General: Alert and oriented. and No acute distress. Gait: Slow, steady gait.  No tenderness to palpation of the lower spine.  No tenderness to palpation over the greater trochanter laterally.  Negative straight leg raise.  He has good lower body strength.  Sensation is intact.  Minimal discomfort with internal and external rotation of his left hip.  IMAGING: No new imaging obtained today     New Medications:  No orders of the defined types were placed in this encounter.     Oliver Barre,  MD  08/23/2023 11:09 AM
